# Patient Record
Sex: Male | Born: 1980 | Race: Black or African American | Hispanic: No | Marital: Single | Smoking: Current every day smoker
Health system: Southern US, Community
[De-identification: ages and names within clinical notes are randomized; demographics above are authoritative.]

---

## 2013-12-04 ENCOUNTER — Emergency Department (HOSPITAL_COMMUNITY): Payer: Self-pay

## 2013-12-04 ENCOUNTER — Emergency Department (HOSPITAL_COMMUNITY)
Admission: EM | Admit: 2013-12-04 | Discharge: 2013-12-04 | Disposition: A | Discharge status: Home / Self Care | Payer: Self-pay | Attending: Emergency Medicine | Admitting: Emergency Medicine

## 2013-12-04 ENCOUNTER — Encounter (HOSPITAL_COMMUNITY): Payer: Self-pay | Admitting: Emergency Medicine

## 2013-12-04 DIAGNOSIS — B9789 Other viral agents as the cause of diseases classified elsewhere: Secondary | ICD-10-CM | POA: Insufficient documentation

## 2013-12-04 DIAGNOSIS — R5381 Other malaise: Secondary | ICD-10-CM | POA: Insufficient documentation

## 2013-12-04 DIAGNOSIS — B349 Viral infection, unspecified: Secondary | ICD-10-CM

## 2013-12-04 DIAGNOSIS — J3489 Other specified disorders of nose and nasal sinuses: Secondary | ICD-10-CM | POA: Insufficient documentation

## 2013-12-04 DIAGNOSIS — R05 Cough: Secondary | ICD-10-CM | POA: Insufficient documentation

## 2013-12-04 DIAGNOSIS — R509 Fever, unspecified: Secondary | ICD-10-CM

## 2013-12-04 DIAGNOSIS — R5383 Other fatigue: Secondary | ICD-10-CM

## 2013-12-04 DIAGNOSIS — R059 Cough, unspecified: Secondary | ICD-10-CM | POA: Insufficient documentation

## 2013-12-04 DIAGNOSIS — F17200 Nicotine dependence, unspecified, uncomplicated: Secondary | ICD-10-CM | POA: Insufficient documentation

## 2013-12-04 DIAGNOSIS — R000 Tachycardia, unspecified: Secondary | ICD-10-CM | POA: Insufficient documentation

## 2013-12-04 DIAGNOSIS — R112 Nausea with vomiting, unspecified: Secondary | ICD-10-CM

## 2013-12-04 DIAGNOSIS — R630 Anorexia: Secondary | ICD-10-CM | POA: Insufficient documentation

## 2013-12-04 DIAGNOSIS — R197 Diarrhea, unspecified: Secondary | ICD-10-CM | POA: Insufficient documentation

## 2013-12-04 MED ORDER — ONDANSETRON HCL 4 MG/2ML IJ SOLN
4.0000 mg | Freq: Once | INTRAMUSCULAR | Status: AC
  Administered 2013-12-04: 4 mg via INTRAVENOUS
  Filled 2013-12-04: qty 2

## 2013-12-04 MED ORDER — IBUPROFEN 800 MG PO TABS
800.0000 mg | ORAL_TABLET | Freq: Once | ORAL | Status: AC
  Administered 2013-12-04: 800 mg via ORAL
  Filled 2013-12-04: qty 2

## 2013-12-04 MED ORDER — SODIUM CHLORIDE 0.9 % IV BOLUS (SEPSIS)
1000.0000 mL | Freq: Once | INTRAVENOUS | Status: AC
  Administered 2013-12-04: 1000 mL via INTRAVENOUS

## 2013-12-04 MED ORDER — ONDANSETRON 8 MG PO TBDP: ORAL_TABLET | ORAL | Status: DC

## 2013-12-04 MED ORDER — PROMETHAZINE HCL 25 MG PO TABS: 25.0000 mg | ORAL_TABLET | Freq: Four times a day (QID) | ORAL | Status: DC | PRN

## 2013-12-04 NOTE — ED Notes (Signed)
Pt in c/o cough, congestion, fever, vomiting and body aches sine Tuesday, generalized weakness today

## 2013-12-04 NOTE — Discharge Instructions (Signed)
Continue to drink plenty of fluids to stay hydrated. Take Tylenol or ibuprofen for fever and bodyaches. Wash your hands frequently to prevent spread of infection. Return for any change or worsening symptoms. Follow up with a primary care provider for continued evaluation and treatment.    Viral Infections A viral infection can be caused by different types of viruses.Most viral infections are not serious and resolve on their own. However, some infections may cause severe symptoms and may lead to further complications. SYMPTOMS Viruses can frequently cause:  Minor sore throat.  Aches and pains.  Headaches.  Runny nose.  Different types of rashes.  Watery eyes.  Tiredness.  Cough.  Loss of appetite.  Gastrointestinal infections, resulting in nausea, vomiting, and diarrhea. These symptoms do not respond to antibiotics because the infection is not caused by bacteria. However, you might catch a bacterial infection following the viral infection. This is sometimes called a "superinfection." Symptoms of such a bacterial infection may include:  Worsening sore throat with pus and difficulty swallowing.  Swollen neck glands.  Chills and a high or persistent fever.  Severe headache.  Tenderness over the sinuses.  Persistent overall ill feeling (malaise), muscle aches, and tiredness (fatigue).  Persistent cough.  Yellow, green, or brown mucus production with coughing. HOME CARE INSTRUCTIONS   Only take over-the-counter or prescription medicines for pain, discomfort, diarrhea, or fever as directed by your caregiver.  Drink enough water and fluids to keep your urine clear or pale yellow. Sports drinks can provide valuable electrolytes, sugars, and hydration.  Get plenty of rest and maintain proper nutrition. Soups and broths with crackers or rice are fine. SEEK IMMEDIATE MEDICAL CARE IF:   You have severe headaches, shortness of breath, chest pain, neck pain, or an unusual  rash.  You have uncontrolled vomiting, diarrhea, or you are unable to keep down fluids.  You or your child has an oral temperature above 102 F (38.9 C), not controlled by medicine.  Your baby is older than 3 months with a rectal temperature of 102 F (38.9 C) or higher.  Your baby is 47 months old or younger with a rectal temperature of 100.4 F (38 C) or higher. MAKE SURE YOU:   Understand these instructions.  Will watch your condition.  Will get help right away if you are not doing well or get worse. Document Released: 08/20/2005 Document Revised: 02/02/2012 Document Reviewed: 03/17/2011 [facility] Patient Information 2014 [place], [place].     Fever, Adult A fever is a temperature of 100.4 F (38 C) or above.  HOME CARE  Take fever medicine as told by your doctor. Do not  take aspirin for fever if you are younger than 33 years of age.  If you are given antibiotic medicine, take it as told. Finish the medicine even if you start to feel better.  Rest.  Drink enough fluids to keep your pee (urine) clear or pale yellow. Do not drink alcohol.  Take a bath or shower with room temperature water. Do not use ice water or alcohol sponge baths.  Wear lightweight, loose clothes. GET HELP RIGHT AWAY IF:   You are short of breath or have trouble breathing.  You are very weak.  You are dizzy or you pass out (faint).  You are very thirsty or are making little or no urine.  You have new pain.  You throw up (vomit) or have watery poop (diarrhea).  You keep throwing up or having watery poop for more than 1 to  2 days.  You have a stiff neck or light bothers your eyes.  You have a skin rash.  You have a fever or problems (symptoms) that last for more than 2 to 3 days.  You have a fever and your problems quickly get worse.  You keep throwing up the fluids you drink.  You do not feel better after 3 days.  You have new problems. MAKE SURE YOU:   Understand these  instructions.  Will watch your condition.  Will get help right away if you are not doing well or get worse. Document Released: 08/19/2008 Document Revised: 02/02/2012 Document Reviewed: 09/11/2011 [facility] Patient Information 2014 [place].

## 2013-12-04 NOTE — ED Notes (Signed)
Patient states he feels much better after fluids.

## 2013-12-04 NOTE — ED Provider Notes (Signed)
CSN: [id number]     Arrival date & time 12/04/13  2042 History   First MD Initiated Contact with Patient 12/04/13 2148     Chief Complaint  Patient presents with  . Flu like symptoms    HPI  History provided by the patient and significant other. Patient is a 89 to-year-old male with a current smoker presenting with symptoms of fever, cough, nausea, vomiting and diarrhea. Patient states symptoms first began Tuesday with generalized fatigue and weakness. He then began having some congestion and cough symptoms. Over the past few days he reports having episodes of nausea, vomiting and occasional loose diarrhea stools. States he has had poor by mouth intake secondary to nausea and vomiting. He has also had increased lightheadedness especially with standing up with near syncope. His significant other was recently sick with similar fever cough symptoms. He has not used any medications due to nausea and vomiting. No other aggravating or alleviating factors. No associated symptoms.    History reviewed. No pertinent past medical history. History reviewed. No pertinent past surgical history. History reviewed. No pertinent family history. History  Substance Use Topics  . Smoking status: Current Every Day Smoker  . Smokeless tobacco: Not on file  . Alcohol Use: Not on file    Review of Systems  Constitutional: Positive for fever, appetite change and fatigue.  HENT: Positive for congestion.   Respiratory: Positive for cough. Negative for shortness of breath.   Cardiovascular: Negative for chest pain.  Gastrointestinal: Positive for nausea, vomiting and diarrhea. Negative for abdominal pain.  All other systems reviewed and are negative.    Allergies  Review of patient's allergies indicates no known allergies.  Home Medications   Current Outpatient Rx  Name  Route  Sig  Dispense  Refill  . Echinacea 400 MG CAPS   Oral   Take 1,200 mg by mouth daily as needed (for cold).          .  Multiple Vitamins-Minerals (ZINC PO)   Oral   Take 1 tablet by mouth daily as needed (for cold).           BP 112/68  Pulse 108  Temp(Src) 101.9 F (38.8 C) (Oral)  Resp 20  Wt 205 lb (92.987 kg)  SpO2 95% Physical Exam  Nursing note and vitals reviewed. Constitutional: He is oriented to person, place, and time. He appears well-developed and well-nourished. No distress.  HENT:  Head: Normocephalic.  Right Ear: Tympanic membrane normal.  Left Ear: Tympanic membrane normal.  Mouth/Throat: Oropharynx is clear and moist.  Neck: Normal range of motion. Neck supple.  No meningeal signs  Cardiovascular: Regular rhythm.  Tachycardia present.   Pulmonary/Chest: Effort normal and breath sounds normal. No respiratory distress. He has no wheezes. He has no rales.  Abdominal: Soft. There is no tenderness.  Musculoskeletal: Normal range of motion.  Neurological: He is alert and oriented to person, place, and time.  Skin: Skin is warm. No rash noted.  Psychiatric: He has a normal mood and affect. His behavior is normal.    ED Course  Procedures   DIAGNOSTIC STUDIES: Oxygen Saturation is 98% on room air.    COORDINATION OF CARE:  Nursing notes reviewed. Vital signs reviewed. Initial pt interview and examination performed.   10:12 PM-patient seen and evaluated. Does not appear in any acute distress. Does not appear severely ill or toxic. He is there all at triage. Does have improvement with ibuprofen. No nausea vomiting so far in the emergency  department.   Chest x-ray reviewed. No concerning findings. No signs of pneumonia. Symptoms consistent with viral infection likely influenza.  Treatment plan initiated: Medications  sodium chloride 0.9 % bolus 1,000 mL (not administered)  ondansetron (ZOFRAN) injection 4 mg (not administered)  ibuprofen (ADVIL,MOTRIN) tablet 800 mg (800 mg Oral Given 12/04/13 2051)    Imaging Review Dg Chest 2 View  12/04/2013   CLINICAL DATA:  Flu-like  symptoms.  Cough.  EXAM: CHEST  2 VIEW  COMPARISON:  None.  FINDINGS: The heart size and mediastinal contours are within normal limits. Both lungs are clear. The visualized skeletal structures are unremarkable.  IMPRESSION: No active cardiopulmonary disease.   Electronically Signed   By: Tiburcio Pea M.D.   On: 12/04/2013 21:36     MDM   1. Viral infection   2. Fever   3. Nausea vomiting and diarrhea        Angus Seller, PA-C 12/04/13 2242

## 2013-12-06 NOTE — ED Provider Notes (Signed)
Medical screening examination/treatment/procedure(s) were performed by non-physician practitioner and as supervising physician I was immediately available for consultation/collaboration.  EKG Interpretation   None         Suzi RootsKevin E Bannon Giammarco, MD 12/06/13 1153

## 2017-09-08 ENCOUNTER — Emergency Department (HOSPITAL_COMMUNITY)
Admission: EM | Admit: 2017-09-08 | Discharge: 2017-09-08 | Disposition: A | Discharge status: Home / Self Care | Payer: No Typology Code available for payment source | Attending: Emergency Medicine | Admitting: Emergency Medicine

## 2017-09-08 ENCOUNTER — Emergency Department (HOSPITAL_COMMUNITY): Payer: No Typology Code available for payment source

## 2017-09-08 ENCOUNTER — Encounter (HOSPITAL_COMMUNITY): Payer: Self-pay | Admitting: Emergency Medicine

## 2017-09-08 DIAGNOSIS — M25561 Pain in right knee: Secondary | ICD-10-CM | POA: Insufficient documentation

## 2017-09-08 DIAGNOSIS — Y999 Unspecified external cause status: Secondary | ICD-10-CM | POA: Diagnosis not present

## 2017-09-08 DIAGNOSIS — Y9389 Activity, other specified: Secondary | ICD-10-CM | POA: Insufficient documentation

## 2017-09-08 DIAGNOSIS — F17200 Nicotine dependence, unspecified, uncomplicated: Secondary | ICD-10-CM | POA: Insufficient documentation

## 2017-09-08 DIAGNOSIS — S6991XA Unspecified injury of right wrist, hand and finger(s), initial encounter: Secondary | ICD-10-CM | POA: Diagnosis present

## 2017-09-08 DIAGNOSIS — S60221A Contusion of right hand, initial encounter: Secondary | ICD-10-CM | POA: Insufficient documentation

## 2017-09-08 DIAGNOSIS — Y92410 Unspecified street and highway as the place of occurrence of the external cause: Secondary | ICD-10-CM | POA: Diagnosis not present

## 2017-09-08 MED ORDER — NAPROXEN 500 MG PO TABS: 500.0000 mg | ORAL_TABLET | Freq: Two times a day (BID) | ORAL | 0 refills | Status: DC

## 2017-09-08 MED ORDER — CYCLOBENZAPRINE HCL 10 MG PO TABS: 10.0000 mg | ORAL_TABLET | Freq: Two times a day (BID) | ORAL | 0 refills | Status: DC | PRN
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
## 2017-09-08 NOTE — ED Notes (Signed)
Pt verbalizes understanding of d/c instructions. Pt received prescriptions. Pt ambulatory at d/c with all belongings.  
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
## 2017-09-08 NOTE — Discharge Instructions (Signed)
Monitor for vomiting, abdominal pain, severe headache.  Expect to be stiff and sore for the next few days.

## 2017-09-08 NOTE — ED Notes (Signed)
See EDP assessment 

## 2018-01-14 ENCOUNTER — Emergency Department (HOSPITAL_COMMUNITY)
Admission: EM | Admit: 2018-01-14 | Discharge: 2018-01-14 | Disposition: A | Discharge status: Home / Self Care | Payer: Self-pay | Attending: Emergency Medicine | Admitting: Emergency Medicine

## 2018-01-14 ENCOUNTER — Encounter (HOSPITAL_COMMUNITY): Payer: Self-pay | Admitting: Emergency Medicine

## 2018-01-14 DIAGNOSIS — R55 Syncope and collapse: Secondary | ICD-10-CM | POA: Insufficient documentation

## 2018-01-14 DIAGNOSIS — F17200 Nicotine dependence, unspecified, uncomplicated: Secondary | ICD-10-CM | POA: Insufficient documentation

## 2018-01-14 LAB — I-STAT CHEM 8, ED
BUN: 7 mg/dL (ref 6–20)
Calcium, Ion: 1.16 mmol/L (ref 1.15–1.40)
Chloride: 106 mmol/L (ref 101–111)
Creatinine, Ser: 0.9 mg/dL (ref 0.61–1.24)
Glucose, Bld: 88 mg/dL (ref 65–99)
HCT: 35 % — ABNORMAL LOW (ref 39.0–52.0)
Hemoglobin: 11.9 g/dL — ABNORMAL LOW (ref 13.0–17.0)
POTASSIUM: 3.7 mmol/L (ref 3.5–5.1)
SODIUM: 144 mmol/L (ref 135–145)
TCO2: 26 mmol/L (ref 22–32)

## 2018-01-14 MED ORDER — SODIUM CHLORIDE 0.9 % IV BOLUS (SEPSIS)
1000.0000 mL | Freq: Once | INTRAVENOUS | Status: AC
  Administered 2018-01-14: 1000 mL via INTRAVENOUS

## 2018-01-14 NOTE — ED Provider Notes (Signed)
[facility] Provider Note   CSN: [id number] Arrival date & time: 01/14/18  0416     History   Chief Complaint Chief Complaint  Patient presents with  . Headache    orthostatic changes    HPI Timothy Arroyo is a 37 y.o. male.  The history is provided by the patient.  Headache   This is a new problem. The problem occurs constantly. The problem has been resolved. The pain is located in the frontal region. The pain is moderate. Associated symptoms include near-syncope. Pertinent negatives include no fever, no syncope, no shortness of breath, no nausea and no vomiting.  Patient reports yesterday he had began having a gradual headache.  He reports that he had been up the night before doing work, and he feels that that was why he had a headache.  He rested most of the day, but reports he did not take much p.o. intake.  Tonight he began to feel lightheaded and thought he might pass out, so EMS was called but he denies syncope.  He reports his headache is resolved.  He denies nausea vomiting diarrhea.  Denies chest pain/shortness of breath He admits to smoking marijuana just prior to the episode of lightheadedness tonight. He denies any other medical conditions.  EMS reported that patient was hypotensive on standing, his blood pressure dropped to the 80s.  Likely due to orthostatic hypotension  PMH-none Soc hx-cannabis abuse Home Medications    Prior to Admission medications   Medication Sig Start Date End Date Taking? Authorizing Provider  cyclobenzaprine (FLEXERIL) 10 MG tablet Take 1 tablet (10 mg total) by mouth 2 (two) times daily as needed for muscle spasms. Patient not taking: Reported on 01/14/2018 09/08/17   Linwood DibblesKnapp, Jon, MD  naproxen (NAPROSYN) 500 MG tablet Take 1 tablet (500 mg total) by mouth 2 (two) times daily. Patient not taking: Reported on 01/14/2018 09/08/17   Linwood DibblesKnapp, Jon, MD  ondansetron [facility](ZOFRAN ODT) 8 MG disintegrating tablet 8mg   ODT q4 hours prn nausea Patient not taking: Reported on 09/08/2017 12/04/13   Ivonne Andrewammen, Peter, PA-C  promethazine (PHENERGAN) 25 MG tablet Take 1 tablet (25 mg total) by mouth every 6 (six) hours as needed for nausea. Patient not taking: Reported on 09/08/2017 12/04/13   Ivonne Andrewammen, Peter, PA-C    Family History No family history on file.  Social History Social History   Tobacco Use  . Smoking status: Current Every Day Smoker  . Smokeless tobacco: Never Used  Substance Use Topics  . Alcohol use: No    Frequency: Never  . Drug use: Yes    Types: Marijuana     Allergies   Patient has no known allergies.   Review of Systems Review of Systems  Constitutional: Negative for fever.  Respiratory: Negative for shortness of breath.   Cardiovascular: Positive for near-syncope. Negative for chest pain and syncope.  Gastrointestinal: Negative for nausea and vomiting.  Neurological: Positive for light-headedness and headaches. Negative for syncope.  All other systems reviewed and are negative.    Physical Exam Updated Vital Signs BP 114/69 (BP Location: Right Arm)   Pulse 66   Resp 16   Ht 1.88 m (6\' 2" )   Wt 83.9 kg (185 lb)   SpO2 100%   BMI 23.75 kg/m   Physical Exam CONSTITUTIONAL: Well developed/well nourished HEAD: Normocephalic/atraumatic EYES: EOMI/PERRL, no nystagmus, no ptosis ENMT: Mucous membranes moist NECK: supple no meningeal signs, no bruits SPINE/BACK:entire spine nontender CV: S1/S2 noted, no murmurs/rubs/gallops  noted LUNGS: Lungs are clear to auscultation bilaterally, no apparent distress ABDOMEN: soft, nontender, no rebound or guarding GU:no cva tenderness NEURO:Awake/alert, face symmetric, no arm or leg drift is noted Equal 5/5 strength with shoulder abduction, elbow flex/extension, wrist flex/extension in upper extremities and equal hand grips bilaterally Equal 5/5 strength with hip flexion,knee flex/extension, foot dorsi/plantar flexion Cranial nerves  3/4/5/6/06/01/09/11/12 tested and intact No past pointing Sensation to light touch intact in all extremities EXTREMITIES: pulses normal, full ROM SKIN: warm, color normal PSYCH: no abnormalities of mood noted, alert and oriented to situation    ED Treatments / Results  Labs (all labs ordered are listed, but only abnormal results are displayed) Labs Reviewed  I-STAT CHEM 8, ED - Abnormal; Notable for the following components:      Result Value   Hemoglobin 11.9 (*)    HCT 35.0 (*)    All other components within normal limits    EKG  EKG Interpretation  Date/Time:  Thursday January 14 2018 04:48:24 EST Ventricular Rate:  67 PR Interval:    QRS Duration: 102 QT Interval:  375 QTC Calculation: 396 R Axis:   79 Text Interpretation:  Sinus rhythm Probable left ventricular hypertrophy No previous ECGs available Confirmed by Zadie Rhine ([id number]) on 01/14/2018 5:03:53 AM       Radiology No results found.  Procedures Procedures    Medications Ordered in ED Medications  sodium chloride 0.9 % bolus 1,000 mL (1,000 mLs Intravenous New Bag/Given 01/14/18 0503)     Initial Impression / Assessment and Plan / ED Course  I have reviewed the triage vital signs and the nursing notes.  Pertinent labs & imaging results that were available during my care of the patient were reviewed by me and considered in my medical decision making (see chart for details).     5:07 AM Patient with near syncopal episode after lack of p.o. intake the previous day.  He also admits to eating a vegan diet.  He also admits to smoking marijuana.  He is awake alert, no distress no neurologic deficits at this time.  Plan to rehydrate and reassess. 6:22 AM Improved, no distress.  He is ambulatory.  He has no new complaints.  He feels improved.  Will discharge home Discussed since he is a vegan, he needs to increase his protein intake.  He needs to increase his p.o. fluid intake.  He also needs to avoid  marijuana Final Clinical Impressions(s) / ED Diagnoses   Final diagnoses:  Near syncope    ED Discharge Orders    None       Zadie Rhine, MD 01/14/18 [phone]

## 2018-01-14 NOTE — ED Triage Notes (Signed)
Pt reports headache and feeling of weakness onset today, reports PO intake. EMS reports initial lying BP 108/60, once standing BP 80/40. 150 ML NS given via IV started in the field. Pt alert x4, no other  complaints at this time.
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
